# Patient Record
Sex: Male | Born: 1990 | Race: White | Hispanic: No | Marital: Single | Smoking: Current every day smoker
Health system: Southern US, Community
[De-identification: ages and names within clinical notes are randomized; demographics above are authoritative.]

## PROBLEM LIST (undated history)

## (undated) DIAGNOSIS — F1910 Other psychoactive substance abuse, uncomplicated: Secondary | ICD-10-CM

## (undated) HISTORY — PX: CLAVICLE SURGERY: SHX598

## (undated) HISTORY — PX: ANTERIOR CRUCIATE LIGAMENT REPAIR: SHX115

---
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
## 2018-01-20 ENCOUNTER — Emergency Department (HOSPITAL_COMMUNITY): Payer: No Typology Code available for payment source

## 2018-01-20 ENCOUNTER — Encounter (HOSPITAL_COMMUNITY)
Admission: EM | Discharge status: Against Medical Advice | Payer: Self-pay | Source: Home / Self Care | Attending: Orthopedic Surgery

## 2018-01-20 ENCOUNTER — Emergency Department (HOSPITAL_COMMUNITY): Payer: No Typology Code available for payment source | Admitting: Anesthesiology

## 2018-01-20 ENCOUNTER — Inpatient Hospital Stay (HOSPITAL_COMMUNITY): Payer: No Typology Code available for payment source

## 2018-01-20 ENCOUNTER — Encounter (HOSPITAL_COMMUNITY): Payer: Self-pay | Admitting: Emergency Medicine

## 2018-01-20 ENCOUNTER — Inpatient Hospital Stay (HOSPITAL_COMMUNITY)
Admission: EM | Admit: 2018-01-20 | Discharge: 2018-01-21 | DRG: 482 | Discharge status: Against Medical Advice | Payer: No Typology Code available for payment source | Attending: Orthopedic Surgery | Admitting: Orthopedic Surgery

## 2018-01-20 DIAGNOSIS — S72301A Unspecified fracture of shaft of right femur, initial encounter for closed fracture: Secondary | ICD-10-CM | POA: Diagnosis present

## 2018-01-20 DIAGNOSIS — S72351A Displaced comminuted fracture of shaft of right femur, initial encounter for closed fracture: Secondary | ICD-10-CM | POA: Diagnosis present

## 2018-01-20 DIAGNOSIS — S7291XA Unspecified fracture of right femur, initial encounter for closed fracture: Secondary | ICD-10-CM | POA: Diagnosis present

## 2018-01-20 HISTORY — PX: FEMUR IM NAIL: SHX1597

## 2018-01-20 LAB — COMPREHENSIVE METABOLIC PANEL
ALT: 78 U/L — ABNORMAL HIGH (ref 17–63)
AST: 62 U/L — ABNORMAL HIGH (ref 15–41)
Albumin: 4.2 g/dL (ref 3.5–5.0)
Alkaline Phosphatase: 41 U/L (ref 38–126)
Anion gap: 13 (ref 5–15)
BUN: 8 mg/dL (ref 6–20)
CO2: 24 mmol/L (ref 22–32)
Calcium: 9.5 mg/dL (ref 8.9–10.3)
Chloride: 104 mmol/L (ref 101–111)
Creatinine, Ser: 0.8 mg/dL (ref 0.61–1.24)
GFR calc Af Amer: >60 mL/min (ref >60)
GFR calc non Af Amer: >60 mL/min (ref >60)
Glucose, Bld: 97 mg/dL (ref 65–99)
Potassium: 3.3 mmol/L — ABNORMAL LOW (ref 3.5–5.1)
Sodium: 141 mmol/L (ref 135–145)
Total Bilirubin: 0.7 mg/dL (ref 0.3–1.2)
Total Protein: 7.1 g/dL (ref 6.5–8.1)

## 2018-01-20 LAB — CBC
HCT: 44.1 % (ref 39.0–52.0)
Hemoglobin: 14.8 g/dL (ref 13.0–17.0)
MCH: 30 pg (ref 26.0–34.0)
MCHC: 33.6 g/dL (ref 30.0–36.0)
MCV: 89.3 fL (ref 78.0–100.0)
Platelets: 313 10*3/uL (ref 150–400)
RBC: 4.94 MIL/uL (ref 4.22–5.81)
RDW: 12.4 % (ref 11.5–15.5)
WBC: 14.7 10*3/uL — ABNORMAL HIGH (ref 4.0–10.5)

## 2018-01-20 LAB — I-STAT CHEM 8, ED
BUN: 9 mg/dL (ref 6–20)
Calcium, Ion: 1.1 mmol/L — ABNORMAL LOW (ref 1.15–1.40)
Chloride: 104 mmol/L (ref 101–111)
Creatinine, Ser: 0.8 mg/dL (ref 0.61–1.24)
Glucose, Bld: 98 mg/dL (ref 65–99)
HCT: 45 % (ref 39.0–52.0)
Hemoglobin: 15.3 g/dL (ref 13.0–17.0)
Potassium: 3.3 mmol/L — ABNORMAL LOW (ref 3.5–5.1)
Sodium: 141 mmol/L (ref 135–145)
TCO2: 23 mmol/L (ref 22–32)

## 2018-01-20 LAB — CDS SEROLOGY

## 2018-01-20 LAB — I-STAT CG4 LACTIC ACID, ED: Lactic Acid, Venous: 3.18 mmol/L (ref 0.5–1.9)

## 2018-01-20 LAB — PROTIME-INR
INR: 0.93
Prothrombin Time: 12.4 seconds (ref 11.4–15.2)

## 2018-01-20 LAB — ETHANOL: Alcohol, Ethyl (B): 138 mg/dL — ABNORMAL HIGH (ref <10)

## 2018-01-20 SURGERY — INSERTION, INTRAMEDULLARY ROD, FEMUR
Anesthesia: Regional | Site: Leg Upper | Laterality: Right

## 2018-01-20 MED ORDER — ONDANSETRON HCL 4 MG/2ML IJ SOLN
INTRAMUSCULAR | Status: DC | PRN
  Administered 2018-01-20: 4 mg via INTRAVENOUS

## 2018-01-20 MED ORDER — ACETAMINOPHEN 325 MG PO TABS: 325.0000 mg | ORAL_TABLET | ORAL | Status: DC | PRN

## 2018-01-20 MED ORDER — DOCUSATE SODIUM 100 MG PO CAPS
100.0000 mg | ORAL_CAPSULE | Freq: Two times a day (BID) | ORAL | Status: DC
  Administered 2018-01-21: 100 mg via ORAL
  Filled 2018-01-20: qty 1

## 2018-01-20 MED ORDER — OXYCODONE HCL 5 MG PO TABS: 5.0000 mg | ORAL_TABLET | Freq: Once | ORAL | Status: DC | PRN

## 2018-01-20 MED ORDER — TETANUS-DIPHTH-ACELL PERTUSSIS 5-2.5-18.5 LF-MCG/0.5 IM SUSP: 0.5000 mL | Freq: Once | INTRAMUSCULAR | Status: DC

## 2018-01-20 MED ORDER — PROPOFOL 10 MG/ML IV BOLUS
INTRAVENOUS | Status: DC | PRN
  Administered 2018-01-20: 200 mg via INTRAVENOUS

## 2018-01-20 MED ORDER — METHOCARBAMOL 1000 MG/10ML IJ SOLN: 500.0000 mg | Freq: Four times a day (QID) | INTRAMUSCULAR | Status: DC | PRN

## 2018-01-20 MED ORDER — LIDOCAINE-EPINEPHRINE (PF) 1.5 %-1:200000 IJ SOLN
INTRAMUSCULAR | Status: DC | PRN
  Administered 2018-01-20: 10 mL via PERINEURAL

## 2018-01-20 MED ORDER — OXYCODONE HCL 5 MG PO TABS
5.0000 mg | ORAL_TABLET | ORAL | Status: DC | PRN
  Administered 2018-01-21 (×3): 10 mg via ORAL
  Filled 2018-01-20 (×3): qty 2

## 2018-01-20 MED ORDER — ROPIVACAINE HCL 7.5 MG/ML IJ SOLN
INTRAMUSCULAR | Status: DC | PRN
  Administered 2018-01-20: 20 mL via PERINEURAL

## 2018-01-20 MED ORDER — MORPHINE SULFATE 2 MG/ML IJ SOLN
INTRAMUSCULAR | Status: AC | PRN
  Administered 2018-01-20: 4 mg via INTRAVENOUS

## 2018-01-20 MED ORDER — LIDOCAINE HCL (CARDIAC) PF 100 MG/5ML IV SOSY
PREFILLED_SYRINGE | INTRAVENOUS | Status: DC | PRN
  Administered 2018-01-20: 60 mg via INTRATRACHEAL

## 2018-01-20 MED ORDER — ROCURONIUM BROMIDE 100 MG/10ML IV SOLN
INTRAVENOUS | Status: DC | PRN
  Administered 2018-01-20: 10 mg via INTRAVENOUS
  Administered 2018-01-20: 40 mg via INTRAVENOUS

## 2018-01-20 MED ORDER — FENTANYL CITRATE (PF) 100 MCG/2ML IJ SOLN
INTRAMUSCULAR | Status: AC
  Filled 2018-01-20: qty 2

## 2018-01-20 MED ORDER — FENTANYL CITRATE (PF) 100 MCG/2ML IJ SOLN
100.0000 ug | Freq: Once | INTRAMUSCULAR | Status: AC
  Administered 2018-01-20: 100 ug via INTRAVENOUS

## 2018-01-20 MED ORDER — PROPOFOL 10 MG/ML IV BOLUS
INTRAVENOUS | Status: AC
  Filled 2018-01-20: qty 20

## 2018-01-20 MED ORDER — HYDROMORPHONE HCL 2 MG/ML IJ SOLN
0.5000 mg | INTRAMUSCULAR | Status: DC | PRN
  Administered 2018-01-20 – 2018-01-21 (×2): 1 mg via INTRAVENOUS
  Filled 2018-01-20 (×2): qty 1

## 2018-01-20 MED ORDER — 0.9 % SODIUM CHLORIDE (POUR BTL) OPTIME
TOPICAL | Status: DC | PRN
Start: 2018-01-20 — End: 2018-01-20
  Administered 2018-01-20 (×2): 1000 mL

## 2018-01-20 MED ORDER — CLONIDINE HCL (ANALGESIA) 100 MCG/ML EP SOLN
150.0000 ug | EPIDURAL | Status: DC
Start: 2018-01-20 — End: 2018-01-21
  Filled 2018-01-20: qty 10

## 2018-01-20 MED ORDER — BUPIVACAINE HCL (PF) 0.5 % IJ SOLN
INTRAMUSCULAR | Status: AC
  Filled 2018-01-20: qty 30

## 2018-01-20 MED ORDER — LACTATED RINGERS IV SOLN
INTRAVENOUS | Status: DC
  Administered 2018-01-20: via INTRAVENOUS

## 2018-01-20 MED ORDER — ASPIRIN EC 81 MG PO TBEC
81.0000 mg | DELAYED_RELEASE_TABLET | Freq: Two times a day (BID) | ORAL | Status: DC
  Administered 2018-01-21: 81 mg via ORAL
  Filled 2018-01-20: qty 1

## 2018-01-20 MED ORDER — MIDAZOLAM HCL 2 MG/2ML IJ SOLN
INTRAMUSCULAR | Status: DC | PRN
  Administered 2018-01-20: 2 mg via INTRAVENOUS

## 2018-01-20 MED ORDER — BUPIVACAINE HCL (PF) 0.5 % IJ SOLN
INTRAMUSCULAR | Status: DC | PRN
  Administered 2018-01-20: 30 mL

## 2018-01-20 MED ORDER — CEFAZOLIN SODIUM-DEXTROSE 2-4 GM/100ML-% IV SOLN
2.0000 g | INTRAVENOUS | Status: AC
  Administered 2018-01-20: 2 g via INTRAVENOUS

## 2018-01-20 MED ORDER — FENTANYL CITRATE (PF) 250 MCG/5ML IJ SOLN
INTRAMUSCULAR | Status: DC | PRN
  Administered 2018-01-20: 200 ug via INTRAVENOUS
  Administered 2018-01-20: 50 ug via INTRAVENOUS

## 2018-01-20 MED ORDER — ONDANSETRON HCL 4 MG/2ML IJ SOLN: 4.0000 mg | Freq: Four times a day (QID) | INTRAMUSCULAR | Status: DC | PRN

## 2018-01-20 MED ORDER — CHLORHEXIDINE GLUCONATE 4 % EX LIQD: 60.0000 mL | Freq: Once | CUTANEOUS | Status: DC

## 2018-01-20 MED ORDER — CEFAZOLIN SODIUM-DEXTROSE 2-4 GM/100ML-% IV SOLN
INTRAVENOUS | Status: AC
  Filled 2018-01-20: qty 100

## 2018-01-20 MED ORDER — ACETAMINOPHEN 325 MG PO TABS
325.0000 mg | ORAL_TABLET | Freq: Four times a day (QID) | ORAL | Status: DC | PRN
  Administered 2018-01-21: 650 mg via ORAL
  Filled 2018-01-20: qty 2

## 2018-01-20 MED ORDER — TRAMADOL HCL 50 MG PO TABS
50.0000 mg | ORAL_TABLET | Freq: Four times a day (QID) | ORAL | Status: DC
  Administered 2018-01-20 – 2018-01-21 (×2): 50 mg via ORAL
  Filled 2018-01-20 (×3): qty 1

## 2018-01-20 MED ORDER — EPHEDRINE SULFATE 50 MG/ML IJ SOLN
INTRAMUSCULAR | Status: DC | PRN
  Administered 2018-01-20: 10 mg via INTRAVENOUS

## 2018-01-20 MED ORDER — CEFAZOLIN SODIUM-DEXTROSE 2-4 GM/100ML-% IV SOLN
2.0000 g | Freq: Four times a day (QID) | INTRAVENOUS | Status: AC
  Administered 2018-01-21 (×2): 2 g via INTRAVENOUS
  Filled 2018-01-20 (×3): qty 100

## 2018-01-20 MED ORDER — SUCCINYLCHOLINE CHLORIDE 20 MG/ML IJ SOLN
INTRAMUSCULAR | Status: DC | PRN
  Administered 2018-01-20: 70 mg via INTRAVENOUS

## 2018-01-20 MED ORDER — FENTANYL CITRATE (PF) 100 MCG/2ML IJ SOLN
INTRAMUSCULAR | Status: AC | PRN
  Administered 2018-01-20: 100 ug via INTRAVENOUS

## 2018-01-20 MED ORDER — OXYCODONE HCL 5 MG/5ML PO SOLN: 5.0000 mg | Freq: Once | ORAL | Status: DC | PRN

## 2018-01-20 MED ORDER — SUGAMMADEX SODIUM 200 MG/2ML IV SOLN
INTRAVENOUS | Status: DC | PRN
  Administered 2018-01-20: 150 mg via INTRAVENOUS

## 2018-01-20 MED ORDER — ACETAMINOPHEN 160 MG/5ML PO SOLN: 325.0000 mg | ORAL | Status: DC | PRN

## 2018-01-20 MED ORDER — LACTATED RINGERS IV SOLN
INTRAVENOUS | Status: DC
  Administered 2018-01-20 (×2): via INTRAVENOUS

## 2018-01-20 MED ORDER — MIDAZOLAM HCL 2 MG/2ML IJ SOLN
INTRAMUSCULAR | Status: AC
  Filled 2018-01-20: qty 2

## 2018-01-20 MED ORDER — METOCLOPRAMIDE HCL 5 MG/ML IJ SOLN: 5.0000 mg | Freq: Three times a day (TID) | INTRAMUSCULAR | Status: DC | PRN

## 2018-01-20 MED ORDER — ONDANSETRON HCL 4 MG PO TABS: 4.0000 mg | ORAL_TABLET | Freq: Four times a day (QID) | ORAL | Status: DC | PRN

## 2018-01-20 MED ORDER — METHOCARBAMOL 500 MG PO TABS
500.0000 mg | ORAL_TABLET | Freq: Four times a day (QID) | ORAL | Status: DC | PRN
  Administered 2018-01-20 – 2018-01-21 (×3): 500 mg via ORAL
  Filled 2018-01-20 (×3): qty 1

## 2018-01-20 MED ORDER — CLONIDINE HCL (ANALGESIA) 100 MCG/ML EP SOLN
EPIDURAL | Status: DC | PRN
  Administered 2018-01-20: .8 mL

## 2018-01-20 MED ORDER — HYDROMORPHONE HCL 2 MG/ML IJ SOLN
INTRAMUSCULAR | Status: AC
  Filled 2018-01-20: qty 1

## 2018-01-20 MED ORDER — FENTANYL CITRATE (PF) 250 MCG/5ML IJ SOLN
INTRAMUSCULAR | Status: AC
  Filled 2018-01-20: qty 5

## 2018-01-20 MED ORDER — HYDROMORPHONE HCL 2 MG/ML IJ SOLN
0.3000 mg | INTRAMUSCULAR | Status: DC | PRN
  Administered 2018-01-20 (×2): 0.5 mg via INTRAVENOUS

## 2018-01-20 MED ORDER — MORPHINE SULFATE (PF) 4 MG/ML IV SOLN
INTRAVENOUS | Status: AC
  Filled 2018-01-20: qty 1

## 2018-01-20 MED ORDER — POVIDONE-IODINE 10 % EX SWAB
2.0000 "application " | Freq: Once | CUTANEOUS | Status: AC
  Administered 2018-01-20: 2 via TOPICAL

## 2018-01-20 MED ORDER — IOHEXOL 300 MG/ML  SOLN
100.0000 mL | Freq: Once | INTRAMUSCULAR | Status: AC | PRN
  Administered 2018-01-20: 100 mL via INTRAVENOUS

## 2018-01-20 MED ORDER — METOCLOPRAMIDE HCL 5 MG PO TABS: 5.0000 mg | ORAL_TABLET | Freq: Three times a day (TID) | ORAL | Status: DC | PRN

## 2018-01-20 SURGICAL SUPPLY — 70 items
BANDAGE ELASTIC 6 VELCRO ST LF (GAUZE/BANDAGES/DRESSINGS) ×1 IMPLANT
BIT DRILL 4.3 FREE (DRILL) IMPLANT
BIT DRILL 4.3 SHORT (BIT) ×1 IMPLANT
BIT DRILL CALIBRTD SHORT 4.9MM (BIT) IMPLANT
BLADE CLIPPER SURG (BLADE) ×1 IMPLANT
BLADE SURG 15 STRL LF DISP TIS (BLADE) ×1 IMPLANT
BLADE SURG 15 STRL SS (BLADE)
COVER MAYO STAND STRL (DRAPES) ×1 IMPLANT
COVER PERINEAL POST (MISCELLANEOUS) ×2 IMPLANT
COVER SURGICAL LIGHT HANDLE (MISCELLANEOUS) ×2 IMPLANT
DRAPE HALF SHEET 40X57 (DRAPES) IMPLANT
DRAPE INCISE IOBAN 66X45 STRL (DRAPES) ×1 IMPLANT
DRAPE ORTHO SPLIT 77X108 STRL (DRAPES)
DRAPE STERI IOBAN 125X83 (DRAPES) ×2 IMPLANT
DRAPE SURG ORHT 6 SPLT 77X108 (DRAPES) IMPLANT
DRESSING AQUACEL AG SP 3.5X10 (GAUZE/BANDAGES/DRESSINGS) IMPLANT
DRESSING AQUACEL AG SP 3.5X4 (GAUZE/BANDAGES/DRESSINGS) IMPLANT
DRILL 4.3 FREE (DRILL) ×4
DRILL CALIBRATED SHORT 4.9MM (BIT) ×2
DRSG AQUACEL AG SP 3.5X10 (GAUZE/BANDAGES/DRESSINGS) ×2
DRSG AQUACEL AG SP 3.5X4 (GAUZE/BANDAGES/DRESSINGS) ×2
DRSG MEPILEX BORDER 4X4 (GAUZE/BANDAGES/DRESSINGS) IMPLANT
DRSG MEPILEX BORDER 4X8 (GAUZE/BANDAGES/DRESSINGS) ×1 IMPLANT
DURAPREP 26ML APPLICATOR (WOUND CARE) ×2 IMPLANT
ELECT REM PT RETURN 9FT ADLT (ELECTROSURGICAL) ×2
ELECTRODE REM PT RTRN 9FT ADLT (ELECTROSURGICAL) ×1 IMPLANT
FACESHIELD WRAPAROUND (MASK) IMPLANT
FACESHIELD WRAPAROUND OR TEAM (MASK) ×1 IMPLANT
GAUZE XEROFORM 5X9 LF (GAUZE/BANDAGES/DRESSINGS) ×1 IMPLANT
GLOVE BIOGEL PI IND STRL 8 (GLOVE) ×1 IMPLANT
GLOVE BIOGEL PI INDICATOR 8 (GLOVE) ×1
GLOVE SURG ORTHO 8.0 STRL STRW (GLOVE) ×3 IMPLANT
GOWN STRL REUS W/ TWL LRG LVL3 (GOWN DISPOSABLE) ×1 IMPLANT
GOWN STRL REUS W/ TWL XL LVL3 (GOWN DISPOSABLE) IMPLANT
GOWN STRL REUS W/TWL LRG LVL3 (GOWN DISPOSABLE) ×4
GOWN STRL REUS W/TWL XL LVL3 (GOWN DISPOSABLE) ×2
GUIDEWIRE BALL NOSE 100CM (WIRE) ×1 IMPLANT
IMMOBILIZER KNEE 22 (SOFTGOODS) ×1 IMPLANT
KIT BASIN OR (CUSTOM PROCEDURE TRAY) ×2 IMPLANT
KIT TURNOVER KIT B (KITS) ×2 IMPLANT
LINER BOOT UNIVERSAL DISP (MISCELLANEOUS) ×1 IMPLANT
MANIFOLD NEPTUNE II (INSTRUMENTS) ×1 IMPLANT
NAIL FEMORAL IM 10X38 RT (Nail) ×1 IMPLANT
NDL HYPO 18GX1.5 BLUNT FILL (NEEDLE) IMPLANT
NDL HYPO 25GX1X1/2 BEV (NEEDLE) IMPLANT
NEEDLE HYPO 18GX1.5 BLUNT FILL (NEEDLE) ×2 IMPLANT
NEEDLE HYPO 25GX1X1/2 BEV (NEEDLE) ×2 IMPLANT
NS IRRIG 1000ML POUR BTL (IV SOLUTION) ×2 IMPLANT
PACK GENERAL/GYN (CUSTOM PROCEDURE TRAY) ×2 IMPLANT
PAD ARMBOARD 7.5X6 YLW CONV (MISCELLANEOUS) ×5 IMPLANT
PIN GUIDE 3.0 THREADED (PIN) ×2 IMPLANT
SCREW ANTE FA STD 6X60 (Screw) ×1 IMPLANT
SCREW BONE 5.0X35MM CORTICAL (Screw) ×1 IMPLANT
SCREW BONE 5.0X37.5MM CORT Z (Screw) ×1 IMPLANT
SCREW CORTICAL HEXAGON 5.0X40 (Screw) ×1 IMPLANT
SCREW HEX HEAD 3.5X32.5 (Screw) ×1 IMPLANT
SCREW Z NAIL 5.0X32.5 CORT (Screw) IMPLANT
SPONGE LAP 4X18 RFD (DISPOSABLE) IMPLANT
STAPLER VISISTAT 35W (STAPLE) ×2 IMPLANT
SUT ETHILON 2 0 FS 18 (SUTURE) ×1 IMPLANT
SUT ETHILON 3 0 PS 1 (SUTURE) ×4 IMPLANT
SUT VIC AB 0 CT1 27 (SUTURE) ×2
SUT VIC AB 0 CT1 27XBRD ANBCTR (SUTURE) IMPLANT
SUT VIC AB 2-0 CTB1 (SUTURE) ×3 IMPLANT
SYR 10ML LL (SYRINGE) ×1 IMPLANT
SYR CONTROL 10ML LL (SYRINGE) ×1 IMPLANT
TAPE STRIPS DRAPE STRL (GAUZE/BANDAGES/DRESSINGS) IMPLANT
TOWEL OR 17X24 6PK STRL BLUE (TOWEL DISPOSABLE) ×1 IMPLANT
TOWEL OR 17X26 10 PK STRL BLUE (TOWEL DISPOSABLE) ×2 IMPLANT
WATER STERILE IRR 1000ML POUR (IV SOLUTION) ×2 IMPLANT
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
## 2018-01-20 NOTE — Transfer of Care (Signed)
Immediate Anesthesia Transfer of Care Note  Patient: Gary Lee  Procedure(s) Performed: INTRAMEDULLARY (IM) NAIL FEMORAL (Right Leg Upper)  Patient Location: PACU  Anesthesia Type:General  Level of Consciousness: awake, alert  and oriented  Airway & Oxygen Therapy: Patient Spontanous Breathing  Post-op Assessment: Report given to RN and Post -op Vital signs reviewed and stable  Post vital signs: Reviewed and stable  Last Vitals:  Vitals Value Taken Time  BP 129/84 01/20/2018  9:55 PM  Temp    Pulse 105 01/20/2018  9:56 PM  Resp 21 01/20/2018  9:56 PM  SpO2 100 % 01/20/2018  9:56 PM  Vitals shown include unvalidated device data.  Last Pain:  Vitals:   01/20/18 1725  TempSrc:   PainSc: 10-Worst pain ever         Complications: No apparent anesthesia complications
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
## 2018-01-20 NOTE — Anesthesia Procedure Notes (Signed)
Anesthesia Regional Block: Femoral nerve block   Pre-Anesthetic Checklist: ,, timeout performed, Correct Patient, Correct Site, Correct Laterality, Correct Procedure, Correct Position, site marked, Risks and benefits discussed,  Surgical consent,  Pre-op evaluation,  At surgeon's request and post-op pain management  Laterality: Lower and Right  Prep: chloraprep       Needles:  Injection technique: Single-shot  Needle Type: Echogenic Stimulator Needle          Additional Needles:   Procedures:,,,, ultrasound used (permanent image in chart),,,,  Narrative:  Start time: 01/20/2018 9:39 PM End time: 01/20/2018 9:42 PM Injection made incrementally with aspirations every 5 mL.  Performed by: Personally  Anesthesiologist: Val Eagle, MD  Additional Notes: H+P and labs reviewed, risks and benefits discussed with patient, procedure tolerated well without complications
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
## 2018-01-20 NOTE — Brief Op Note (Signed)
01/20/2018  9:52 PM  PATIENT:  Gary Lee  27 y.o. male  PRE-OPERATIVE DIAGNOSIS:  RIGHT FEMUR FRACTURE  POST-OPERATIVE DIAGNOSIS:  Right femur fractre  PROCEDURE:  Procedure(s): INTRAMEDULLARY (IM) NAIL FEMORAL  SURGEON:  Surgeon(s): Cammy Copa, MD  ASSISTANT:none  ANESTHESIA:   general  EBL: 75 ml    Total I/O In: 1500 [I.V.:1500] Out: 400 [Urine:350; Blood:50]  BLOOD ADMINISTERED: none  DRAINS: none   LOCAL MEDICATIONS USED:  none  SPECIMEN:  No Specimen  COUNTS:  YES  TOURNIQUET:  * No tourniquets in log *  DICTATION: .Other Dictation: Dictation Number [id number]  PLAN OF CARE: Admit to inpatient   PATIENT DISPOSITION:  PACU - hemodynamically stable

## 2018-01-20 NOTE — Anesthesia Procedure Notes (Signed)
Procedure Name: Intubation Date/Time: 01/20/2018 7:53 PM Performed by: Molli Hazard, CRNA Pre-anesthesia Checklist: Patient identified, Emergency Drugs available, Suction available and Patient being monitored Patient Re-evaluated:Patient Re-evaluated prior to induction Oxygen Delivery Method: Circle system utilized Preoxygenation: Pre-oxygenation with 100% oxygen Induction Type: IV induction, Rapid sequence and Cricoid Pressure applied Laryngoscope Size: Miller and 2 Grade View: Grade I Tube type: Oral Tube size: 7.5 mm Number of attempts: 1 Airway Equipment and Method: Stylet Placement Confirmation: ETT inserted through vocal cords under direct vision,  positive ETCO2 and breath sounds checked- equal and bilateral Secured at: 24 cm Tube secured with: Tape
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
## 2018-01-20 NOTE — ED Notes (Signed)
Labs obtained

## 2018-01-20 NOTE — Anesthesia Preprocedure Evaluation (Signed)
Anesthesia Evaluation  Patient identified by MRN, date of birth, ID band Patient awake    Reviewed: Allergy & Precautions, NPO status , Patient's Chart, lab work & pertinent test results  History of Anesthesia Complications Negative for: history of anesthetic complications  Airway Mallampati: I  TM Distance: >3 FB Neck ROM: Full    Dental  (+) Teeth Intact   Pulmonary neg pulmonary ROS,    breath sounds clear to auscultation       Cardiovascular negative cardio ROS   Rhythm:Regular     Neuro/Psych negative neurological ROS  negative psych ROS   GI/Hepatic negative GI ROS, Neg liver ROS,   Endo/Other  negative endocrine ROS  Renal/GU negative Renal ROS     Musculoskeletal Right femur fx   Abdominal   Peds  Hematology negative hematology ROS (+)   Anesthesia Other Findings   Reproductive/Obstetrics                             Anesthesia Physical Anesthesia Plan  ASA: I  Anesthesia Plan: General   Post-op Pain Management:    Induction: Intravenous, Rapid sequence and Cricoid pressure planned  PONV Risk Score and Plan: 2 and Ondansetron and Dexamethasone  Airway Management Planned: Oral ETT  Additional Equipment: None  Intra-op Plan:   Post-operative Plan: Extubation in OR  Informed Consent: I have reviewed the patients History and Physical, chart, labs and discussed the procedure including the risks, benefits and alternatives for the proposed anesthesia with the patient or authorized representative who has indicated his/her understanding and acceptance.   Dental advisory given  Plan Discussed with: CRNA and Surgeon  Anesthesia Plan Comments:         Anesthesia Quick Evaluation
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
## 2018-01-21 ENCOUNTER — Encounter (HOSPITAL_COMMUNITY): Payer: Self-pay | Admitting: Orthopedic Surgery

## 2018-01-21 LAB — URINALYSIS, ROUTINE W REFLEX MICROSCOPIC
Bilirubin Urine: NEGATIVE
Glucose, UA: NEGATIVE mg/dL
HGB URINE DIPSTICK: NEGATIVE
Ketones, ur: NEGATIVE mg/dL
Leukocytes, UA: NEGATIVE
NITRITE: NEGATIVE
PROTEIN: NEGATIVE mg/dL
SPECIFIC GRAVITY, URINE: 1.008 (ref 1.005–1.030)
pH: 6 (ref 5.0–8.0)

## 2018-01-21 NOTE — Progress Notes (Signed)
Patient left against medical advice. Patient needed to go home so wife can go to work. He stated her income is the household primary source and she cannot miss work. Doctor notified to complete discharge paperwork.
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
## 2018-01-21 NOTE — Op Note (Signed)
NAME: Gary Lee, [place] BBB MEDICAL RECORD [id number] ACCOUNT [id number] DATE OF BIRTH:1991/07/01 FACILITY: [facility] LOCATION: [facility] PHYSICIAN:GREGORY Diamantina ProvidenceS. DEAN, MD  OPERATIVE REPORT  DATE OF PROCEDURE:  01/20/2018  PREOPERATIVE DIAGNOSIS:  Right femur fracture.  POSTOPERATIVE DIAGNOSIS:  Right midshaft femur fracture.  PROCEDURE:  Right midshaft femur fracture intramedullary nailing using Biomet femoral nail 10 mm x 38 cm with 2 proximal and 2 distal interlocking screws.  SURGEON:  Rise PaganiniGregory Dean, MD  ASSISTANT:  None.  ANESTHESIA:  General.  INDICATIONS:  The patient is a 27 year old patient with a right femur fracture who presents for operative management after explanation of risks and benefits.   PROCEDURE IN DETAIL:  The patient was brought to the operating room where general anesthetic was induced.  Preoperative antibiotics were administered.  Timeout was called.  The patient was placed on the fracture table with the right leg under traction  and the left leg in the lithotomy position with the peroneal nerve well padded.  The fracture was reduced with some traction.  Rotation was then matched to the left leg.  At this time, the leg was pre-scrubbed with alcohol and Betadine and allowed to air  dry.  Prepped with DuraPrep solution and draped in a sterile manner.  Ioban used to cover the operative field with a wall drape.  Proximal incision was made a handbreadth proximal to the tip of the trochanter.  Skin and subcutaneous tissue were sharply  divided.  A guide pin was placed into the tip of the trochanter and then placed into the proximal femur.  Proximal reaming was performed after confirmation of good placement of the guide pin.  The fracture reducer was placed and the fracture was reduced  and the guide pin placed across the fracture down to the top of the patella.  Reaming was performed up to 11.5 mm.  A 10 x 38 nail was placed and then 2 proximal interlocking screws were placed and  two distal interlocking screws were placed.  Good  placement was confirmed in the AP and lateral planes under fluoroscopy.  Correct length was noted.  At this time, a thorough irrigation was performed on all 5 incisions.  Each incision was anesthetized with Marcaine and clonidine solution.  Proximal  incision was closed using 0 Vicryl suture, 2-0 Vicryl suture and 3-0 nylon.  The other interlocking screw incisions were irrigated and closed using 2-0 Vicryl and 3-0 nylon.  Aquacel dressing and Ace wrap placed around the right leg.  Femoral nerve block  placed and a knee immobilizer also placed.  The patient tolerated the procedure well without immediate complication and transferred to the recovery room in stable condition.  TN/NUANCE  D:01/20/2018 T:01/21/2018 JOB:[id number]

## 2018-01-21 NOTE — Progress Notes (Signed)
Subjective: Pt stable - pain ok   Objective: Vital signs in last 24 hours: Temp:  [97.6 F (36.4 C)-98.2 F (36.8 C)] 97.6 F (36.4 C) (05/31 0516) Pulse Rate:  [71-111] 75 (05/31 0516) Resp:  [15-25] 18 (05/31 0516) BP: (105-148)/(44-91) 120/78 (05/31 0516) SpO2:  [94 %-100 %] 97 % (05/31 0516) Weight:  [160 lb (72.6 kg)] 160 lb (72.6 kg) (05/30 1741)  Intake/Output from previous day: 05/30 0701 - 05/31 0700 In: 1900 [P.O.:400; I.V.:1500] Out: 1175 [Urine:1125; Blood:50] Intake/Output this shift: Total I/O In: 240 [P.O.:240] Out: 650 [Urine:650]  Exam:  No cellulitis present Compartment soft  Labs: Recent Labs    01/20/18 1710 01/20/18 1727  HGB 14.8 15.3   Recent Labs    01/20/18 1710 01/20/18 1727  WBC 14.7*  --   RBC 4.94  --   HCT 44.1 45.0  PLT 313  --    Recent Labs    01/20/18 1710 01/20/18 1727  NA 141 141  K 3.3* 3.3*  CL 104 104  CO2 24  --   BUN 8 9  CREATININE 0.80 0.80  GLUCOSE 97 98  CALCIUM 9.5  --    Recent Labs    01/20/18 1710  INR 0.93    Assessment/Plan: Plan dc today f/u 10 days   G Scott Glendal Cassaday 01/21/2018, 12:02 PM

## 2018-01-24 ENCOUNTER — Encounter (HOSPITAL_COMMUNITY): Payer: Self-pay | Admitting: Orthopedic Surgery

## 2018-01-24 NOTE — Anesthesia Postprocedure Evaluation (Signed)
Anesthesia Post Note  Patient: Junius RoadsSean Dress  Procedure(s) Performed: INTRAMEDULLARY (IM) NAIL FEMORAL (Right Leg Upper)     Patient location during evaluation: PACU Anesthesia Type: General and Regional Level of consciousness: awake and alert Pain management: pain level controlled Vital Signs Assessment: post-procedure vital signs reviewed and stable Respiratory status: spontaneous breathing, nonlabored ventilation, respiratory function stable and patient connected to nasal cannula oxygen Cardiovascular status: blood pressure returned to baseline and stable Postop Assessment: no apparent nausea or vomiting Anesthetic complications: no    Last Vitals:  Vitals:   01/21/18 0509 01/21/18 0516  BP: 120/78 120/78  Pulse: 71 75  Resp: 18 18  Temp: 36.4 C 36.4 C  SpO2: 100% 97%    Last Pain:  Vitals:   01/21/18 1327  TempSrc:   PainSc: 5                  Alysabeth Scalia
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
## 2019-01-29 IMAGING — DX DG FEMUR 2+V PORT*R*
4 series · 4 of 4 positions shown · non-contrast
Comparison: None.

CLINICAL DATA: Postsurgical study

EXAM:
RIGHT FEMUR PORTABLE 2 VIEW

[femur ap (1 of 2)]
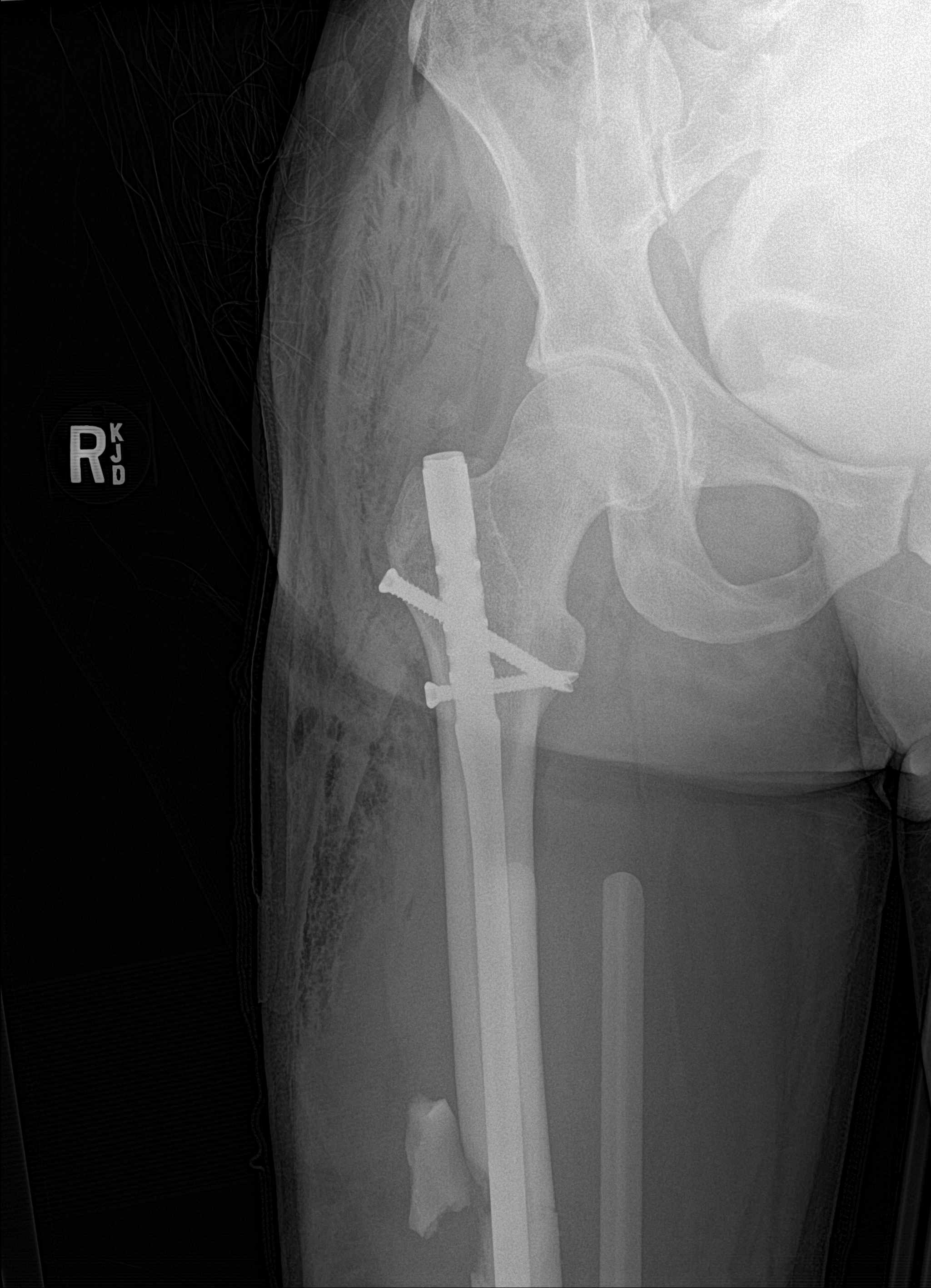

[femur ap (2 of 2)]
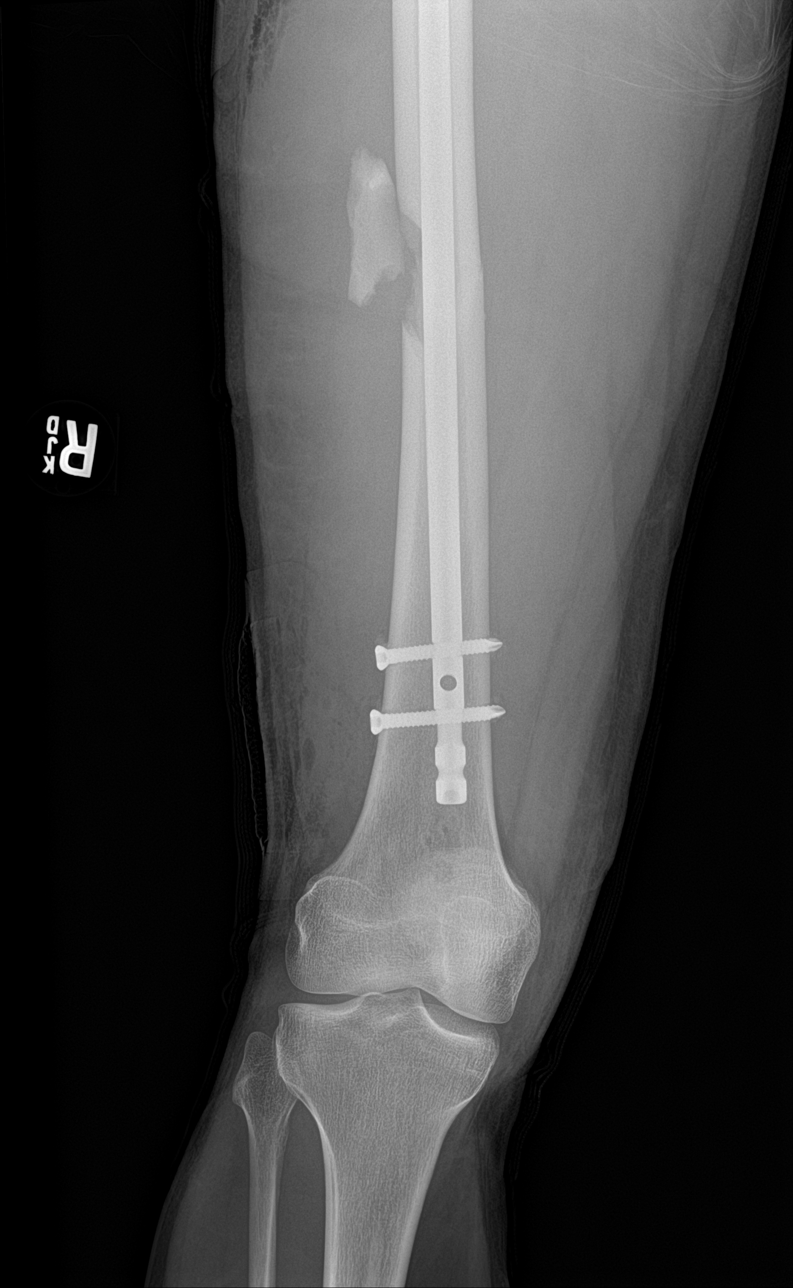

[femur lat (1 of 2)]
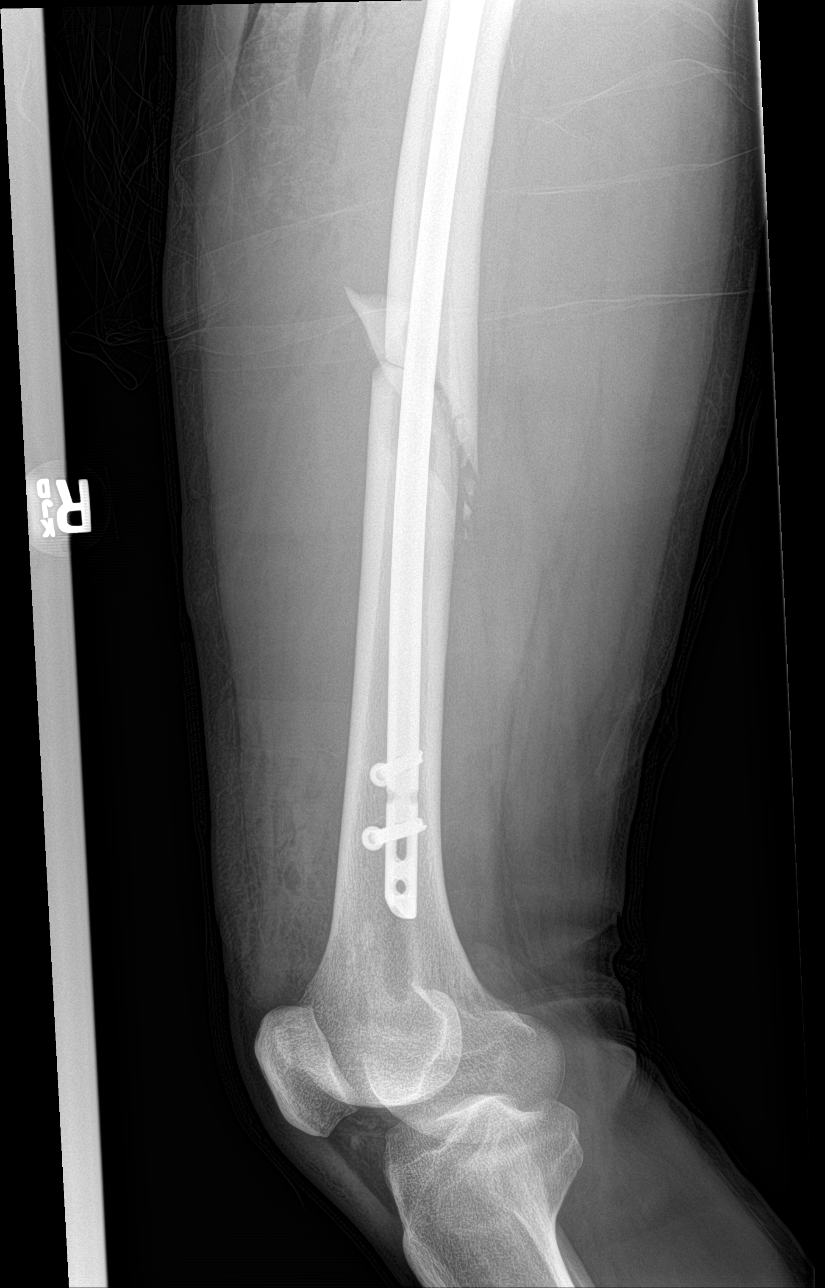

[femur lat (2 of 2)]
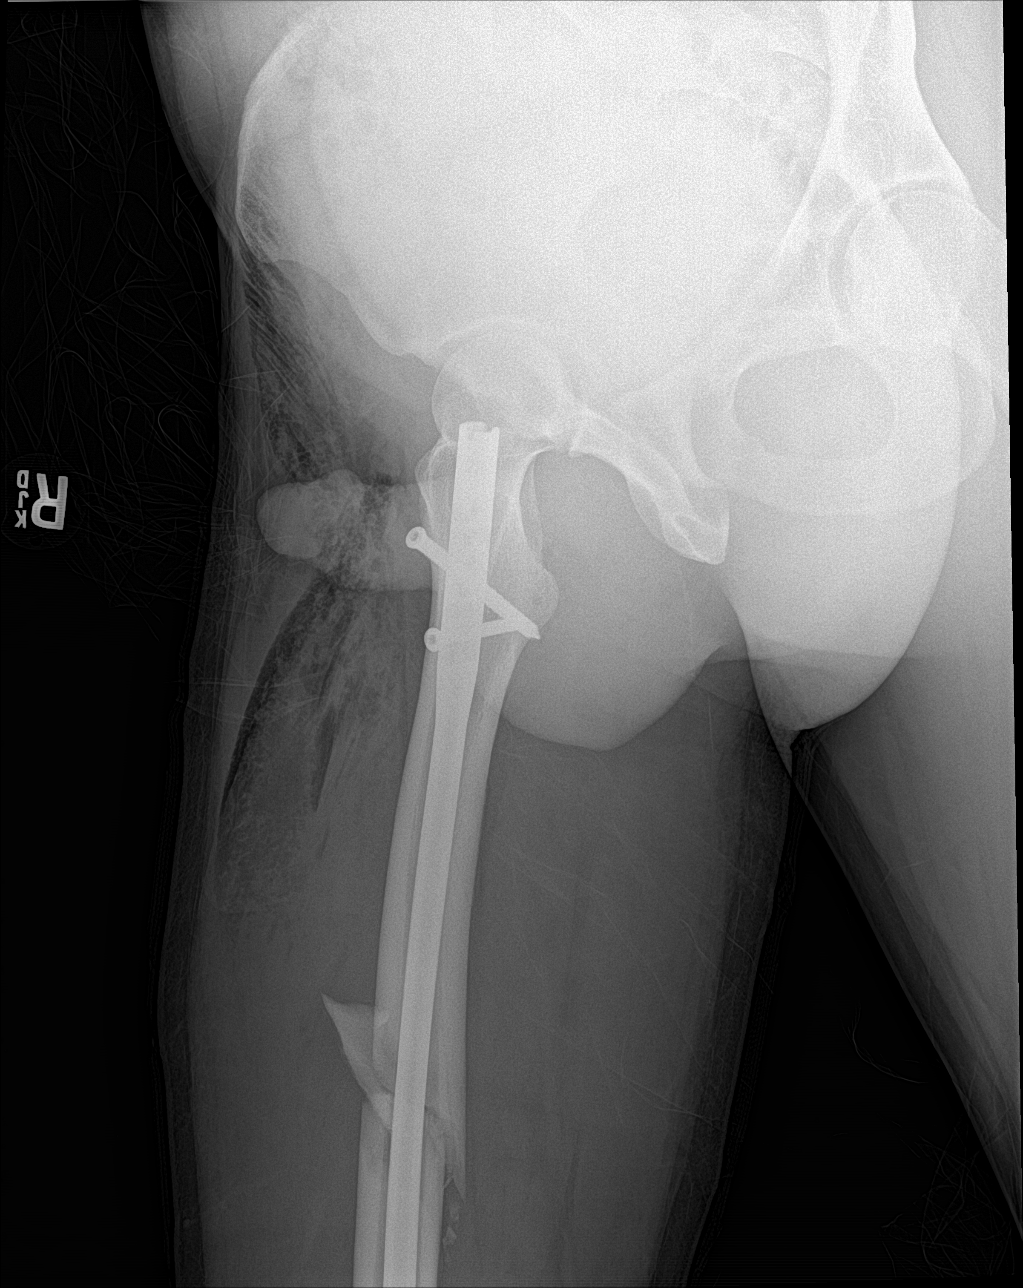

[4 of 4 positions shown; findings below may reference images not displayed]

FINDINGS: A rod has been placed across the mid femoral fracture with proximal
and distal interlocking screws. Hardware is in good position.
IMPRESSION: Hardware placement as above across the mid right femoral fracture.
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
## 2019-02-04 ENCOUNTER — Other Ambulatory Visit: Payer: Self-pay

## 2019-02-04 ENCOUNTER — Emergency Department
Admission: EM | Admit: 2019-02-04 | Discharge: 2019-02-04 | Disposition: A | Discharge status: Rehabilitation Facility | Payer: HRSA Program | Attending: Emergency Medicine | Admitting: Emergency Medicine

## 2019-02-04 DIAGNOSIS — Z20828 Contact with and (suspected) exposure to other viral communicable diseases: Secondary | ICD-10-CM | POA: Diagnosis not present

## 2019-02-04 DIAGNOSIS — F17210 Nicotine dependence, cigarettes, uncomplicated: Secondary | ICD-10-CM | POA: Diagnosis not present

## 2019-02-04 DIAGNOSIS — R509 Fever, unspecified: Secondary | ICD-10-CM | POA: Diagnosis not present

## 2019-02-04 HISTORY — DX: Other psychoactive substance abuse, uncomplicated: F19.10
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
## 2019-02-04 NOTE — Discharge Instructions (Addendum)
You were tested for COVID-19.  This test will be resulted within approximately 2 days.  You will be called with the result.  Until you receive the test result you should isolate as much as possible, minimize contact with others, wear a mask when around anyone else, wash your hands frequently, and stay in your own room as much as possible.  Return to the ER for new, worsening, persistent high fevers, shortness of breath, or any other new or worsening symptoms that concern you.

## 2019-02-04 NOTE — ED Notes (Signed)
Pt advised and encouraged that if at any time he feels he may hurt himself or if he begins to develop respiratory symptoms that he should return to ER regardless of covid results.

## 2019-02-04 NOTE — ED Notes (Signed)
Pt brought to ED by staff member from [facility]. Per the RN Sandy at [facility] pt has had a low grade fever off and on this evening and tonight. Pt is at [facility] for detox from heroin, fentanyl and xanax. [facility] is concerned about the patient having COVID and would like to have him tested for that.

## 2019-02-04 NOTE — ED Triage Notes (Signed)
PT to ED from [facility], pt is withdrawing from heroin, fentanyl, and "benzos", and alcohol. PT states [facility] said his fever was too high and had to come to the hospital. PT afebrile upon arrival, states he does not feel feverish, just like he is detoxing, which he has done before.

## 2019-02-06 ENCOUNTER — Telehealth: Payer: Self-pay | Admitting: Emergency Medicine

## 2019-02-06 LAB — NOVEL CORONAVIRUS, NAA (HOSP ORDER, SEND-OUT TO REF LAB; TAT 18-24 HRS): SARS-CoV-2, NAA: NOT DETECTED

## 2019-02-06 NOTE — Telephone Encounter (Signed)
Called pateint to inform of covid 19 result.  The phone number is his mothers and he does not live there.  She does not have an accurate phone number for him and does not see him often. I told her to have him call me if she sees him.

## 2019-02-07 ENCOUNTER — Telehealth: Payer: Self-pay | Admitting: *Deleted

## 2019-02-07 NOTE — Telephone Encounter (Signed)
Patient is calling to get results- results are negative- he reports he is doing well- will contact PCP for changes. Encouraged return to treatment program and continued success.

## 2020-07-23 ENCOUNTER — Other Ambulatory Visit: Payer: Self-pay

## 2020-07-23 ENCOUNTER — Emergency Department (HOSPITAL_COMMUNITY)
Admission: EM | Admit: 2020-07-23 | Discharge: 2020-07-23 | Disposition: A | Discharge status: Home / Self Care | Payer: Self-pay | Attending: Emergency Medicine | Admitting: Emergency Medicine

## 2020-07-23 ENCOUNTER — Encounter (HOSPITAL_COMMUNITY): Payer: Self-pay | Admitting: Student

## 2020-07-23 DIAGNOSIS — T401X1A Poisoning by heroin, accidental (unintentional), initial encounter: Secondary | ICD-10-CM | POA: Insufficient documentation

## 2020-07-23 DIAGNOSIS — F17210 Nicotine dependence, cigarettes, uncomplicated: Secondary | ICD-10-CM | POA: Insufficient documentation

## 2020-07-23 DIAGNOSIS — H903 Sensorineural hearing loss, bilateral: Secondary | ICD-10-CM | POA: Insufficient documentation

## 2020-07-23 LAB — CBC WITH DIFFERENTIAL/PLATELET
Abs Immature Granulocytes: 0.04 10*3/uL (ref 0.00–0.07)
Basophils Absolute: 0 10*3/uL (ref 0.0–0.1)
Basophils Relative: 0 %
Eosinophils Absolute: 0 10*3/uL (ref 0.0–0.5)
Eosinophils Relative: 0 %
HCT: 49.6 % (ref 39.0–52.0)
Hemoglobin: 16.1 g/dL (ref 13.0–17.0)
Immature Granulocytes: 0 %
Lymphocytes Relative: 7 %
Lymphs Abs: 0.7 10*3/uL (ref 0.7–4.0)
MCH: 29.1 pg (ref 26.0–34.0)
MCHC: 32.5 g/dL (ref 30.0–36.0)
MCV: 89.7 fL (ref 80.0–100.0)
Monocytes Absolute: 0.7 10*3/uL (ref 0.1–1.0)
Monocytes Relative: 7 %
Neutro Abs: 9 10*3/uL — ABNORMAL HIGH (ref 1.7–7.7)
Neutrophils Relative %: 86 %
Platelets: 303 10*3/uL (ref 150–400)
RBC: 5.53 MIL/uL (ref 4.22–5.81)
RDW: 13.2 % (ref 11.5–15.5)
WBC: 10.4 10*3/uL (ref 4.0–10.5)
nRBC: 0 % (ref 0.0–0.2)

## 2020-07-23 LAB — COMPREHENSIVE METABOLIC PANEL
ALT: 60 U/L — ABNORMAL HIGH (ref 0–44)
AST: 38 U/L (ref 15–41)
Albumin: 5.2 g/dL — ABNORMAL HIGH (ref 3.5–5.0)
Alkaline Phosphatase: 54 U/L (ref 38–126)
Anion gap: 9 (ref 5–15)
BUN: 20 mg/dL (ref 6–20)
CO2: 31 mmol/L (ref 22–32)
Calcium: 9.3 mg/dL (ref 8.9–10.3)
Chloride: 99 mmol/L (ref 98–111)
Creatinine, Ser: 0.77 mg/dL (ref 0.61–1.24)
GFR, Estimated: >60 mL/min (ref >60)
Glucose, Bld: 125 mg/dL — ABNORMAL HIGH (ref 70–99)
Potassium: 4.8 mmol/L (ref 3.5–5.1)
Sodium: 139 mmol/L (ref 135–145)
Total Bilirubin: 0.3 mg/dL (ref 0.3–1.2)
Total Protein: 8.7 g/dL — ABNORMAL HIGH (ref 6.5–8.1)

## 2020-07-23 LAB — ETHANOL: Alcohol, Ethyl (B): <10 mg/dL (ref <10)

## 2020-07-23 LAB — SALICYLATE LEVEL: Salicylate Lvl: <7 mg/dL — ABNORMAL LOW (ref 7.0–30.0)

## 2020-07-23 LAB — ACETAMINOPHEN LEVEL: Acetaminophen (Tylenol), Serum: <10 ug/mL — ABNORMAL LOW (ref 10–30)

## 2020-07-23 MED ORDER — NALOXONE HCL 4 MG/0.1ML NA LIQD
1.0000 | Freq: Once | NASAL | Status: AC
  Administered 2020-07-23: 1 via NASAL
  Filled 2020-07-23: qty 4

## 2020-07-23 NOTE — ED Notes (Signed)
Patient wanting to discharge.  Patient just vomited a large amount of vomit, he's asking for water.  Criss Alvine, EDP made aware.
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
## 2020-07-23 NOTE — ED Notes (Signed)
Patient given cup of ice water per [place], EDP

## 2020-07-23 NOTE — ED Notes (Signed)
ED Provider at bedside. 

## 2020-07-23 NOTE — ED Provider Notes (Signed)
[facility]-EMERGENCY DEPT Provider Note   CSN: [id number] Arrival date & time: 07/23/20  1222     History Chief Complaint  Patient presents with  . Drug Overdose    Gary Lee is a 29 y.o. male.  HPI 29 year old male presents with opiate overdose.  Patient was apparently found unresponsive and agonal in his truck.  He was given intranasal Narcan and is now awake and alert.  He states that he has not used drugs since May 30 but then "messed up" today.  He states he used cocaine and heroin.  He snorted it.  He did drink some alcohol as well.  Since awakening he has been having bilateral hearing loss and tinnitus.  No headache.   Past Medical History:  Diagnosis Date  . Substance abuse [facility])     Patient Active Problem List   Diagnosis Date Noted  . Femur fracture, right (HCC) 01/20/2018    Past Surgical History:  Procedure Laterality Date  . ANTERIOR CRUCIATE LIGAMENT REPAIR Left   . CLAVICLE SURGERY    . FEMUR IM NAIL Right 01/20/2018   Procedure: INTRAMEDULLARY (IM) NAIL FEMORAL;  Surgeon: Cammy Copa, MD;  Location: [facility] OR;  Service: Orthopedics;  Laterality: Right;       History reviewed. No pertinent family history.  Social History   Tobacco Use  . Smoking status: Current Every Day Smoker    Packs/day: 0.50  . Smokeless tobacco: Never Used  Vaping Use  . Vaping Use: Never used  Substance Use Topics  . Alcohol use: Not Currently  . Drug use: Yes    Types: IV    Comment: hx of fentanyl, klonipine, heroin     Home Medications Prior to Admission medications   Not on File    Allergies    Patient has no known allergies.  Review of Systems   Review of Systems  HENT: Positive for hearing loss and tinnitus.   Neurological: Negative for headaches.  All other systems reviewed and are negative.   Physical Exam Updated Vital Signs BP 97/67 (BP Location: Left Arm)   Pulse 91   Temp 98.9 F (37.2 C) (Oral)   Resp 11    SpO2 97%   Physical Exam Vitals and nursing note reviewed.  Constitutional:      Appearance: He is well-developed.  HENT:     Head: Normocephalic and atraumatic.     Right Ear: Tympanic membrane and external ear normal.     Left Ear: Tympanic membrane and external ear normal.     Ears:     Comments: Decreased hearing bilaterally    Nose: Nose normal.  Eyes:     General:        Right eye: No discharge.        Left eye: No discharge.     Extraocular Movements: Extraocular movements intact.     Pupils: Pupils are equal, round, and reactive to light.  Cardiovascular:     Rate and Rhythm: Regular rhythm. Tachycardia present.     Heart sounds: Normal heart sounds.     Comments: HR~100 Pulmonary:     Effort: Pulmonary effort is normal.     Breath sounds: Normal breath sounds.  Abdominal:     Palpations: Abdomen is soft.     Tenderness: There is no abdominal tenderness.  Musculoskeletal:     Cervical back: Neck supple.  Skin:    General: Skin is warm and dry.  Neurological:     Mental  Status: He is alert.     Comments: CN 3-12 grossly intact. 5/5 strength in all 4 extremities. Grossly normal sensation. Normal finger to nose.   Psychiatric:        Mood and Affect: Mood is not anxious.     ED Results / Procedures / Treatments   Labs (all labs ordered are listed, but only abnormal results are displayed) Labs Reviewed  SALICYLATE LEVEL - Abnormal; Notable for the following components:      Result Value   Salicylate Lvl <7.0 (*)    All other components within normal limits  ACETAMINOPHEN LEVEL - Abnormal; Notable for the following components:   Acetaminophen (Tylenol), Serum <10 (*)    All other components within normal limits  COMPREHENSIVE METABOLIC PANEL - Abnormal; Notable for the following components:   Glucose, Bld 125 (*)    Total Protein 8.7 (*)    Albumin 5.2 (*)    ALT 60 (*)    All other components within normal limits  CBC WITH DIFFERENTIAL/PLATELET -  Abnormal; Notable for the following components:   Neutro Abs 9.0 (*)    All other components within normal limits  ETHANOL    EKG EKG Interpretation  Date/Time:  Tuesday July 23 2020 13:25:41 EST Ventricular Rate:  96 PR Interval:  156 QRS Duration: 90 QT Interval:  358 QTC Calculation: 452 R Axis:   80 Text Interpretation: Normal sinus rhythm no acute ST/T changes No old tracing to compare Confirmed by Pricilla Loveless [phone]) on 07/23/2020 1:30:01 PM   Radiology No results found.  Procedures Procedures (including critical care time)  Medications Ordered in ED Medications  naloxone (NARCAN) nasal spray 4 mg/0.1 mL (has no administration in time range)    ED Course  I have reviewed the triage vital signs and the nursing notes.  Pertinent labs & imaging results that were available during my care of the patient were reviewed by me and considered in my medical decision making (see chart for details).    MDM Rules/Calculators/A&P                          Patient with what sounds like an accidental opiate overdose.  The patient is complaining of bilateral sensorineural hearing loss which I discussed with Dr. Elijah Birk and he endorses no acute intervention needed now but needs to see ENT for official audiologic testing.  Otherwise, the patient is much more awake and alert with time and has been here for over 3 hours with no need for repeat Narcan dosing.  He is asking to leave.  I think this is reasonable.  Will discharge with intranasal Narcan.  Otherwise, discharge home and will order peer support. Final Clinical Impression(s) / ED Diagnoses Final diagnoses:  Accidental overdose of heroin, initial encounter (HCC)  Sensorineural hearing loss (SNHL) of both ears    Rx / DC Orders ED Discharge Orders    None       Pricilla Loveless, MD 07/23/20 1524

## 2021-02-20 ENCOUNTER — Ambulatory Visit: Payer: Self-pay | Admitting: Sports Medicine

## 2021-02-28 ENCOUNTER — Ambulatory Visit: Payer: Self-pay | Admitting: Sports Medicine
